# Patient Record
Sex: Female | Born: 1975 | Race: White | Hispanic: No | Marital: Married | Smoking: Never smoker
Health system: Southern US, Community
[De-identification: ages and names within clinical notes are randomized; demographics above are authoritative.]

## PROBLEM LIST (undated history)

## (undated) ENCOUNTER — Inpatient Hospital Stay (HOSPITAL_COMMUNITY): Payer: Self-pay

## (undated) DIAGNOSIS — O09529 Supervision of elderly multigravida, unspecified trimester: Secondary | ICD-10-CM

## (undated) DIAGNOSIS — N02B9 Other recurrent and persistent immunoglobulin A nephropathy: Secondary | ICD-10-CM

## (undated) DIAGNOSIS — Z8619 Personal history of other infectious and parasitic diseases: Secondary | ICD-10-CM

## (undated) DIAGNOSIS — R000 Tachycardia, unspecified: Secondary | ICD-10-CM

## (undated) DIAGNOSIS — N189 Chronic kidney disease, unspecified: Secondary | ICD-10-CM

## (undated) DIAGNOSIS — Z87898 Personal history of other specified conditions: Secondary | ICD-10-CM

## (undated) DIAGNOSIS — N028 Recurrent and persistent hematuria with other morphologic changes: Secondary | ICD-10-CM

## (undated) DIAGNOSIS — E785 Hyperlipidemia, unspecified: Secondary | ICD-10-CM

## (undated) DIAGNOSIS — I493 Ventricular premature depolarization: Secondary | ICD-10-CM

## (undated) HISTORY — DX: Personal history of other specified conditions: Z87.898

## (undated) HISTORY — DX: Other recurrent and persistent immunoglobulin A nephropathy: N02.B9

## (undated) HISTORY — DX: Tachycardia, unspecified: R00.0

## (undated) HISTORY — DX: Ventricular premature depolarization: I49.3

## (undated) HISTORY — PX: RENAL BIOPSY: SHX156

## (undated) HISTORY — DX: Hyperlipidemia, unspecified: E78.5

## (undated) HISTORY — PX: WISDOM TOOTH EXTRACTION: SHX21

## (undated) HISTORY — PX: OTHER SURGICAL HISTORY: SHX169

## (undated) HISTORY — DX: Recurrent and persistent hematuria with other morphologic changes: N02.8

## (undated) HISTORY — DX: Supervision of elderly multigravida, unspecified trimester: O09.529

## (undated) HISTORY — DX: Personal history of other infectious and parasitic diseases: Z86.19

## (undated) HISTORY — DX: Chronic kidney disease, unspecified: N18.9

---

## 1998-04-20 ENCOUNTER — Other Ambulatory Visit: Admission: RE | Admit: 1998-04-20 | Discharge: 1998-04-20 | Payer: Self-pay | Admitting: Obstetrics and Gynecology

## 1999-12-17 ENCOUNTER — Other Ambulatory Visit: Admission: RE | Admit: 1999-12-17 | Discharge: 1999-12-17 | Payer: Self-pay | Admitting: Gynecology

## 2001-01-05 ENCOUNTER — Other Ambulatory Visit: Admission: RE | Admit: 2001-01-05 | Discharge: 2001-01-05 | Payer: Self-pay | Admitting: Gynecology

## 2002-11-05 ENCOUNTER — Other Ambulatory Visit: Admission: RE | Admit: 2002-11-05 | Discharge: 2002-11-05 | Payer: Self-pay | Admitting: Gynecology

## 2003-10-20 ENCOUNTER — Other Ambulatory Visit: Admission: RE | Admit: 2003-10-20 | Discharge: 2003-10-20 | Payer: Self-pay | Admitting: Obstetrics and Gynecology

## 2004-02-03 ENCOUNTER — Inpatient Hospital Stay (HOSPITAL_COMMUNITY): Admission: AD | Admit: 2004-02-03 | Discharge: 2004-02-03 | Payer: Self-pay | Admitting: Obstetrics & Gynecology

## 2004-02-07 ENCOUNTER — Inpatient Hospital Stay (HOSPITAL_COMMUNITY): Admission: AD | Admit: 2004-02-07 | Discharge: 2004-02-07 | Payer: Self-pay | Admitting: Obstetrics and Gynecology

## 2004-02-15 ENCOUNTER — Ambulatory Visit (HOSPITAL_COMMUNITY): Admission: RE | Admit: 2004-02-15 | Discharge: 2004-02-15 | Payer: Self-pay | Admitting: Obstetrics and Gynecology

## 2004-03-09 ENCOUNTER — Inpatient Hospital Stay (HOSPITAL_COMMUNITY): Admission: AD | Admit: 2004-03-09 | Discharge: 2004-03-10 | Payer: Self-pay | Admitting: Obstetrics and Gynecology

## 2004-03-09 ENCOUNTER — Encounter (INDEPENDENT_AMBULATORY_CARE_PROVIDER_SITE_OTHER): Payer: Self-pay | Admitting: *Deleted

## 2005-02-11 ENCOUNTER — Other Ambulatory Visit: Admission: RE | Admit: 2005-02-11 | Discharge: 2005-02-11 | Payer: Self-pay | Admitting: Obstetrics and Gynecology

## 2005-05-17 IMAGING — US US OB COMP LESS 14 WK
1 series · 18 of 23 positions shown · non-contrast
Comparison: none

CLINICAL DATA: Vaginal bleeding. 

 OBSTETRICAL ULTRASOUND
 Number of Fetuses:  1
 Heart Rate:  176 BPM
 CRL:  5.5 cm, 12 W 1 D
 Ultrasound EDC:  08/16/04
 Fetal anatomy could not be evaluated due to the early gestational age.
 MATERNAL FINDINGS
 Right ovary measures 3.3 x 1.7 x 2.2 and left ovary measures 2.6 x 1.7 x 2.6 cm.

[Series 1: us ob comp<14 wk · 18 of 23 slices shown]
[im 1/23]
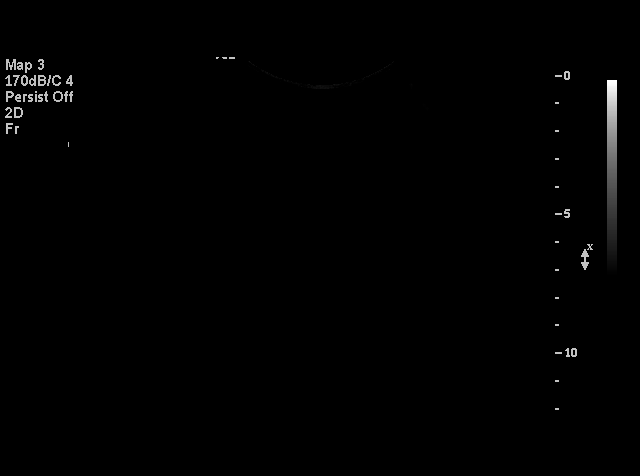
[im 2/23]
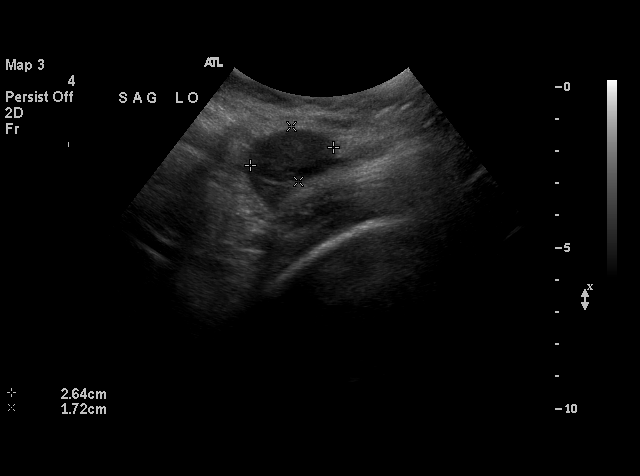
[im 4/23]
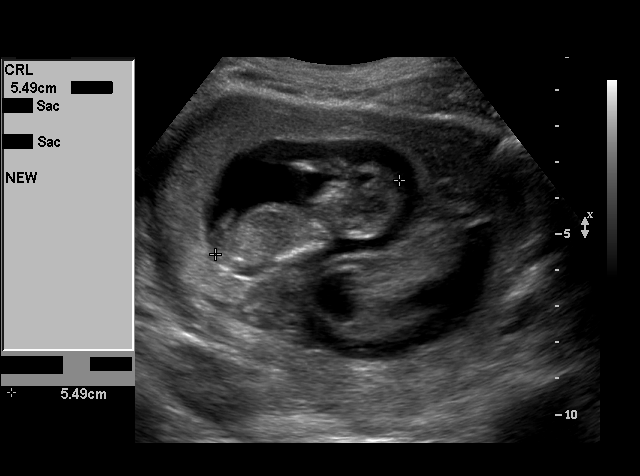
[im 5/23]
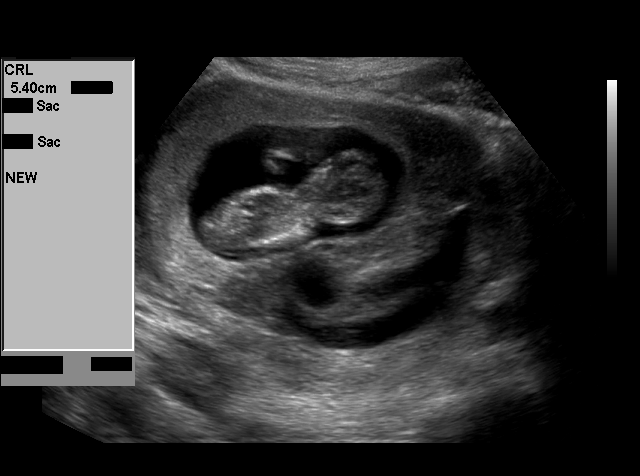
[im 6/23]
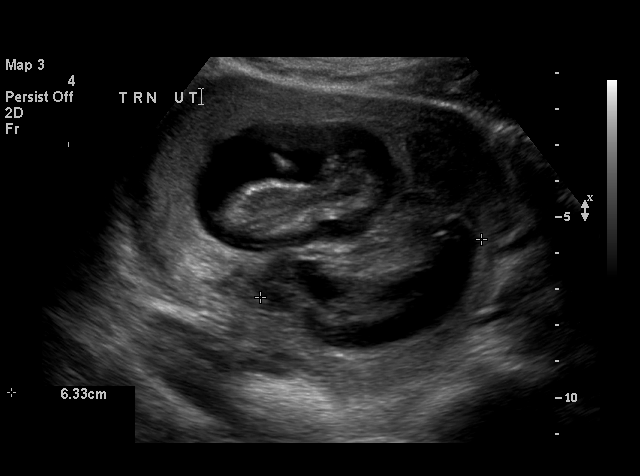
[im 8/23]
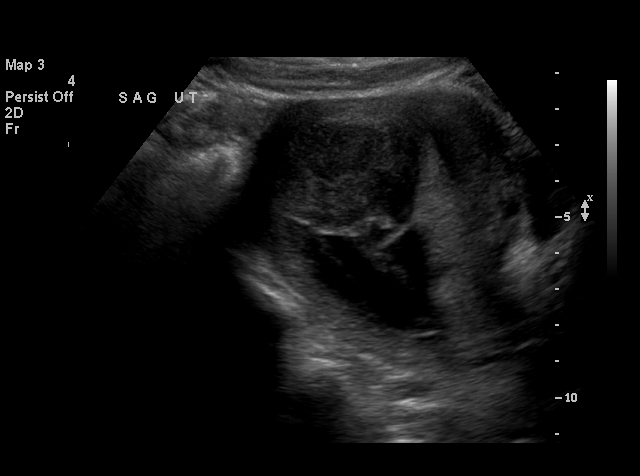
[im 9/23]
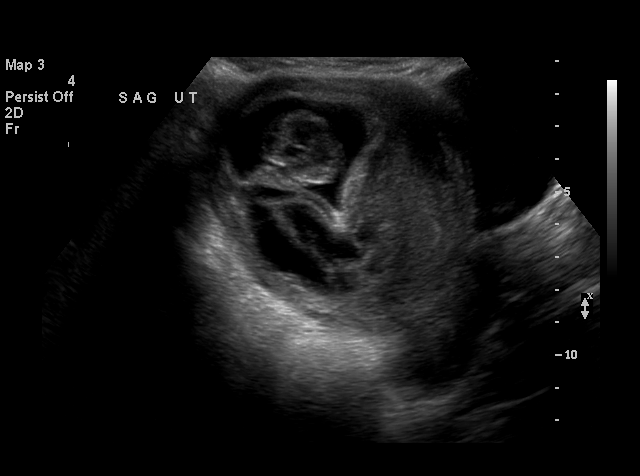
[im 10/23]
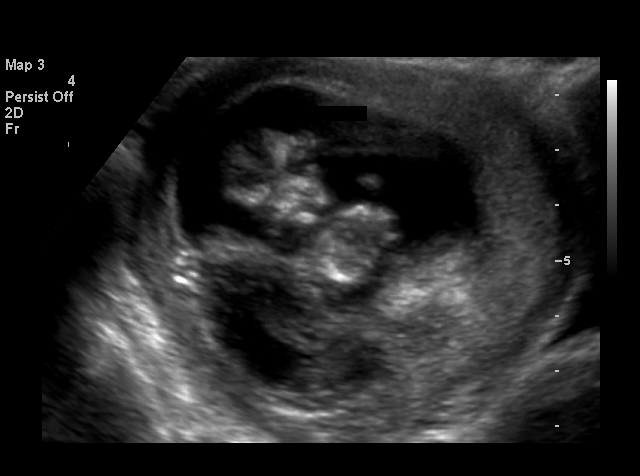
[im 11/23]
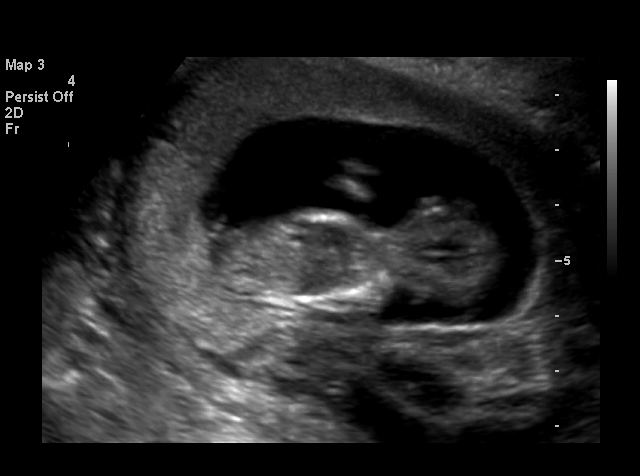
[im 13/23]
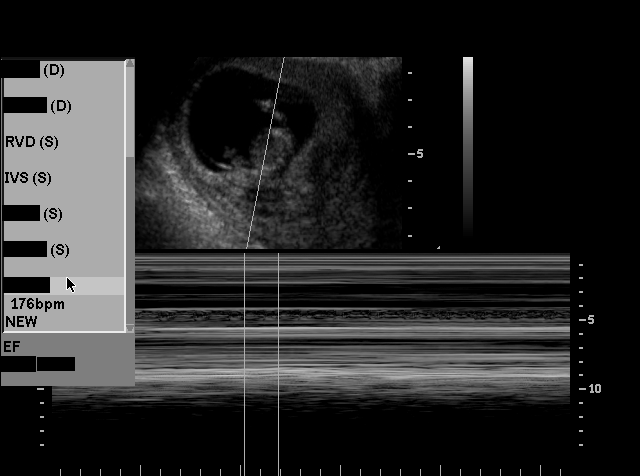
[im 14/23]
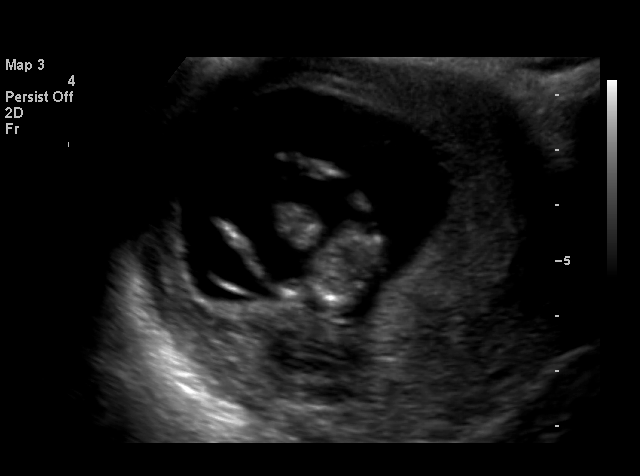
[im 15/23]
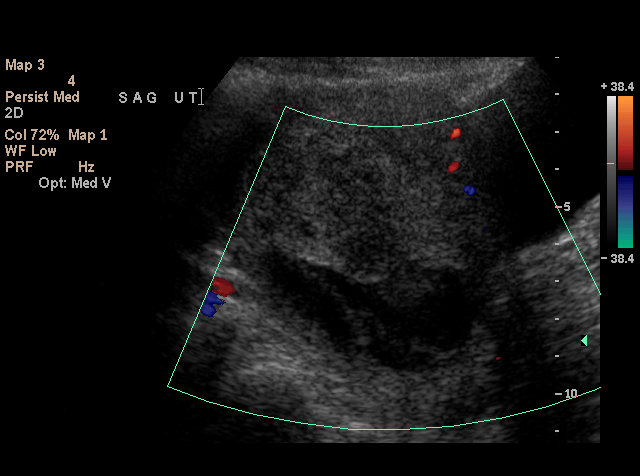
[im 16/23]
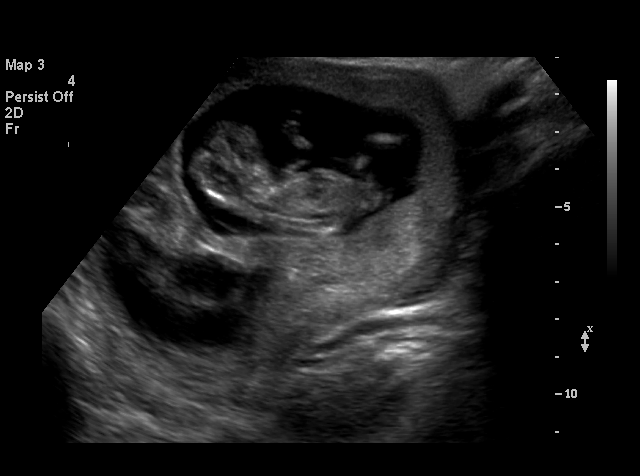
[im 18/23]
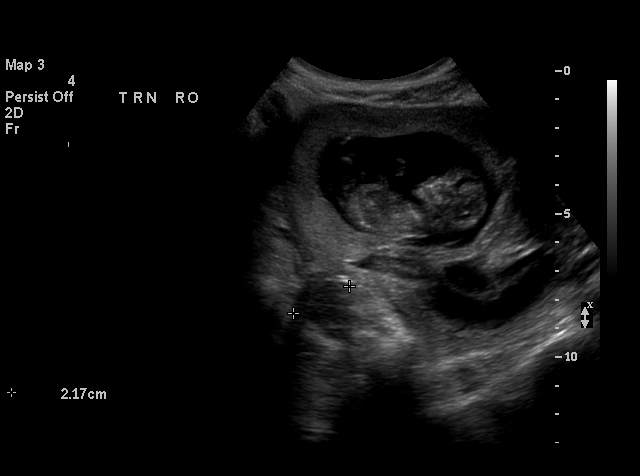
[im 19/23]
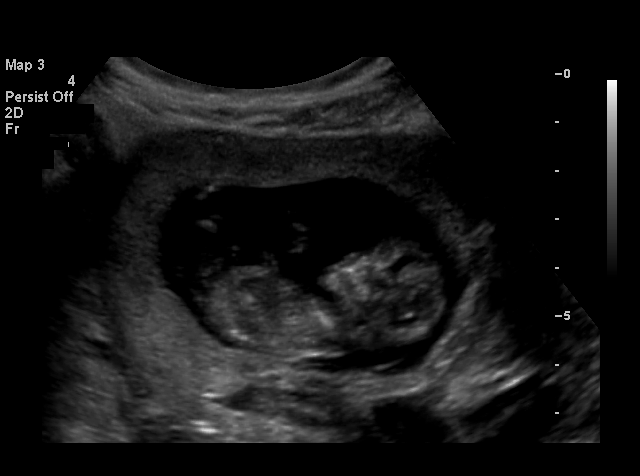
[im 20/23]
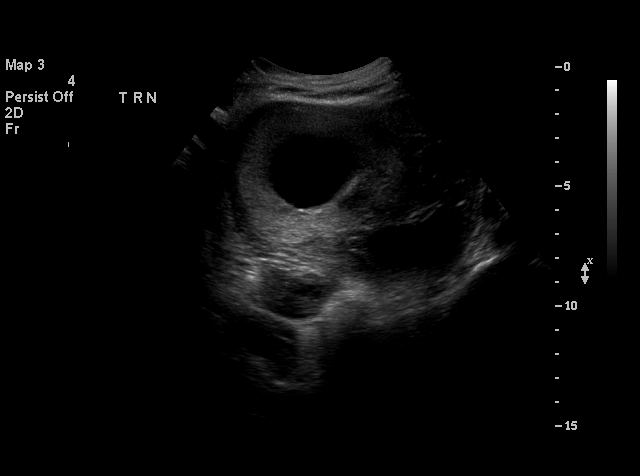
[im 22/23]
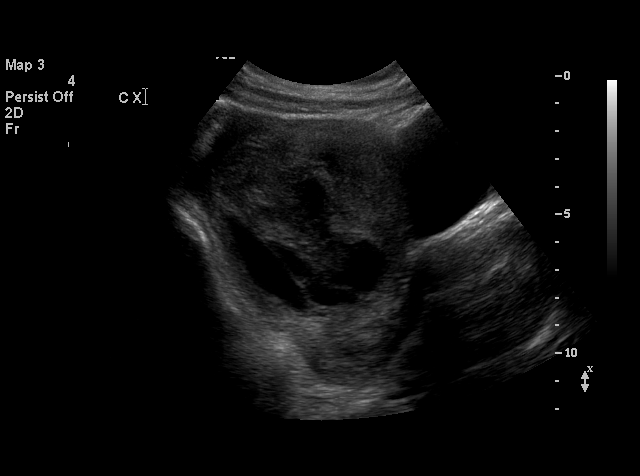
[im 23/23]
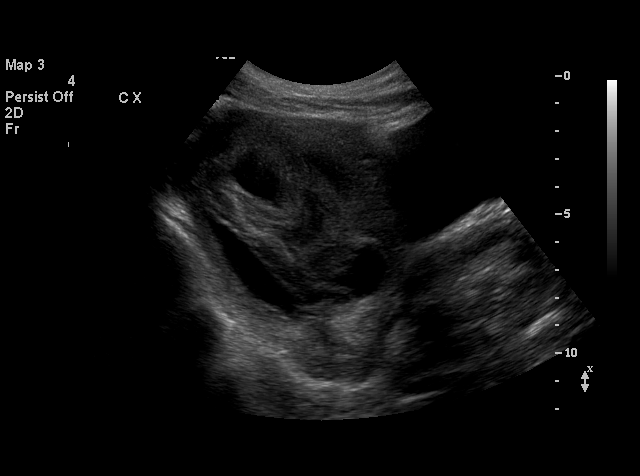

[18 of 23 positions shown; findings below may reference images not displayed]

IMPRESSION: Single living intrauterine gestation with crown-rump length of 5.5 cm, consistent with 12 week 1 day gestation.  Moderate subchorionic hemorrhage noted.  Normal maternal ovaries.

## 2005-05-29 IMAGING — US US OB COMP +14 WK
1 series · 13 of 28 positions shown · non-contrast
Comparison: none

CLINICAL DATA: G1 P0.  LMP 11/07/03.  Bleeding.  Known subchorionic hemorrhage.

[Series 1: unknown · 0.32mm/px · 13 of 35 slices shown]
[im 2/35]
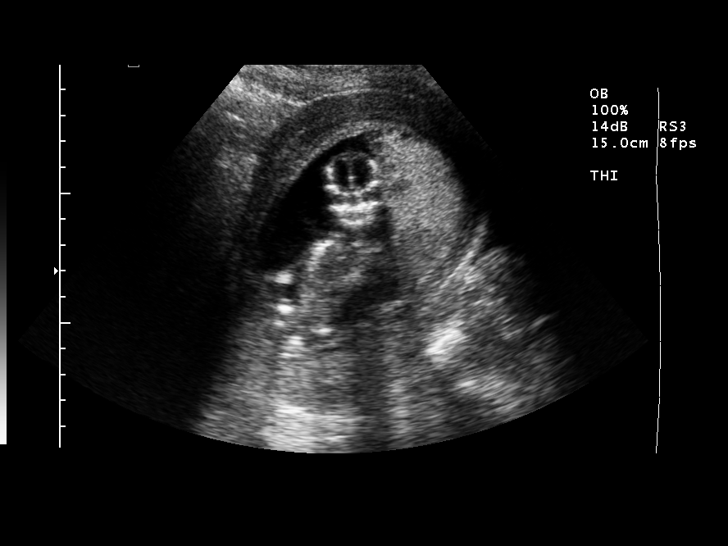
[im 4/35]
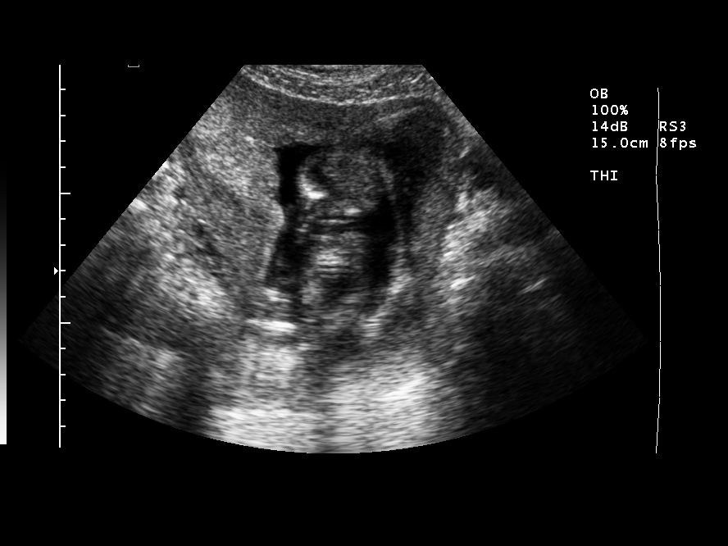
[im 7/35]
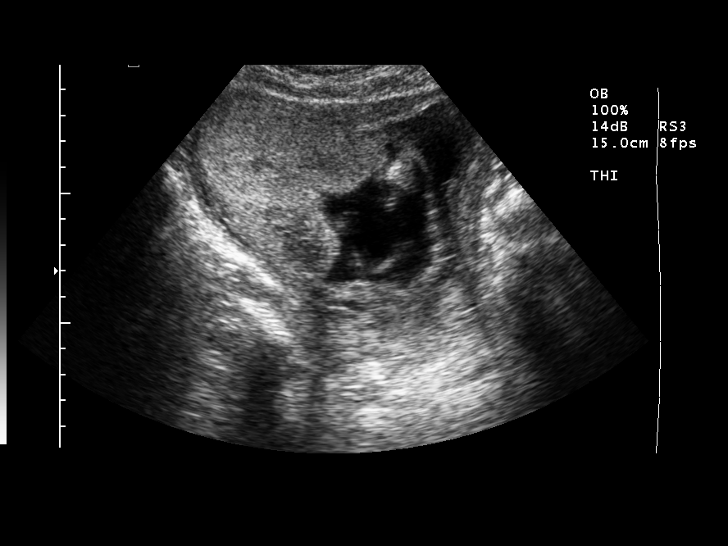
[im 9/35]
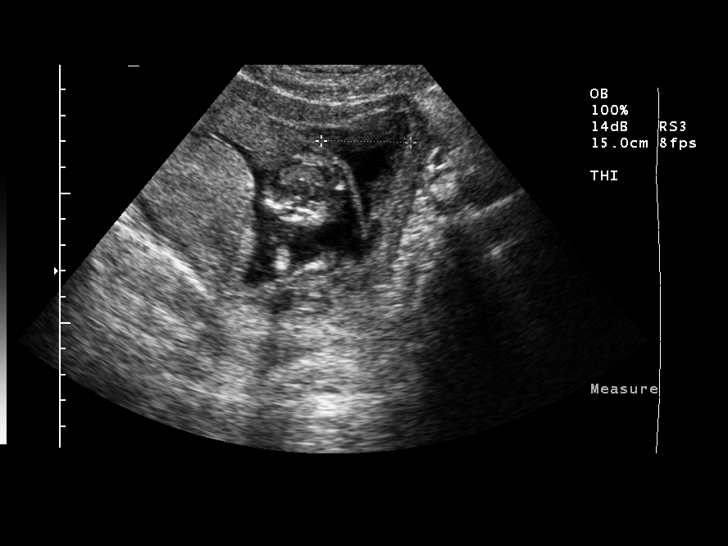
[im 12/35]
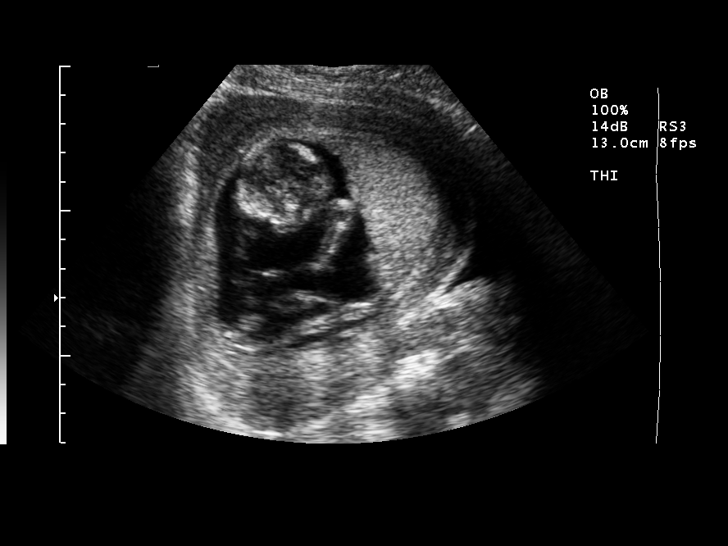
[im 14/35]
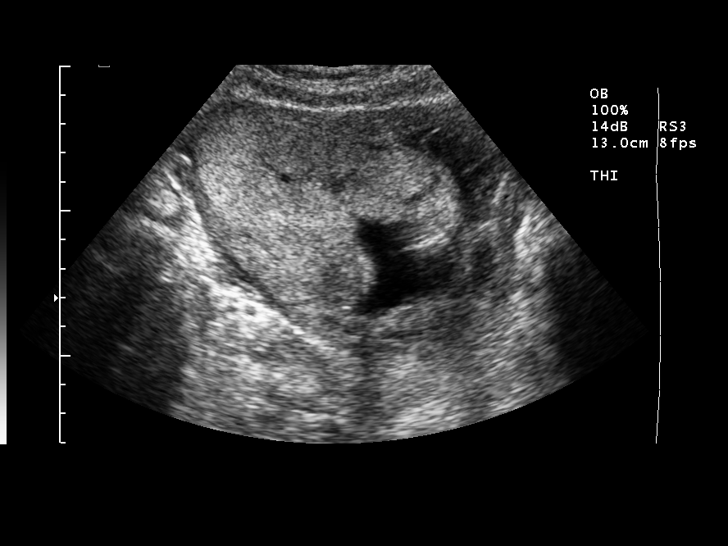
[im 18/35]
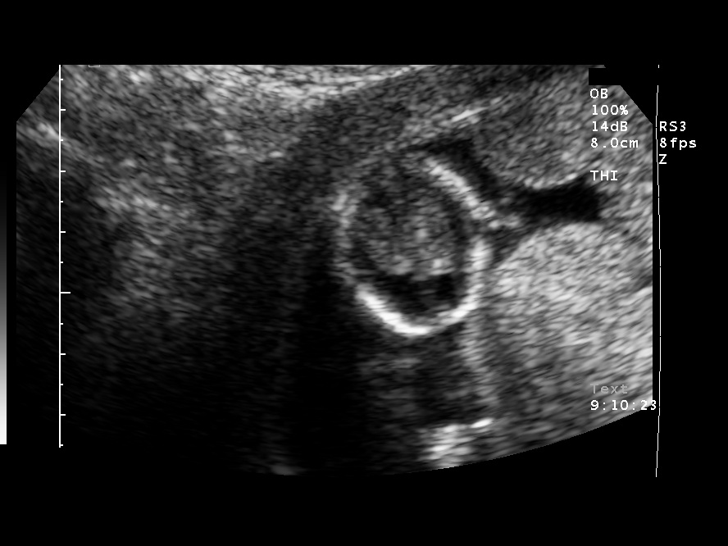
[im 21/35]
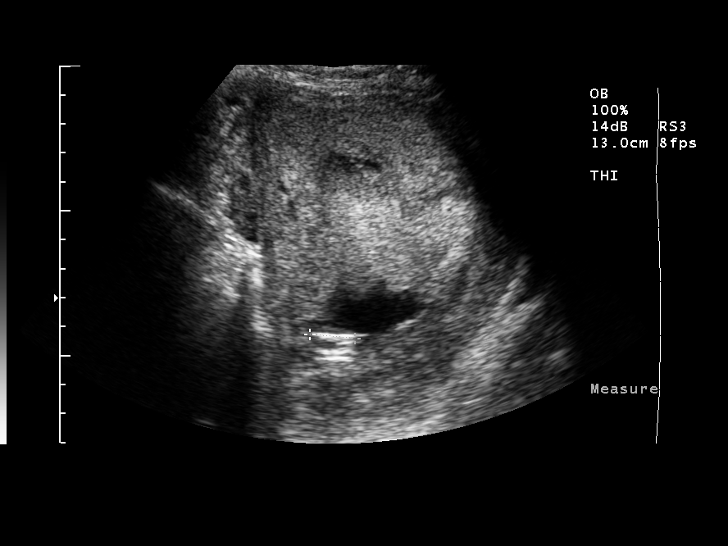
[im 23/35]
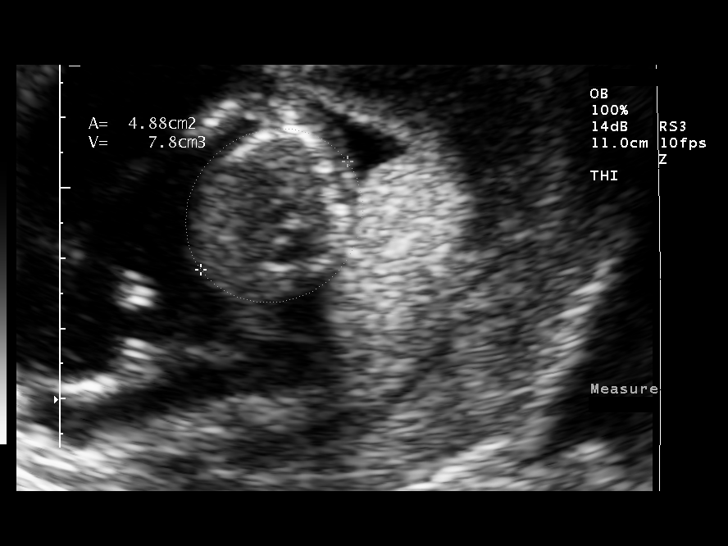
[im 26/35]
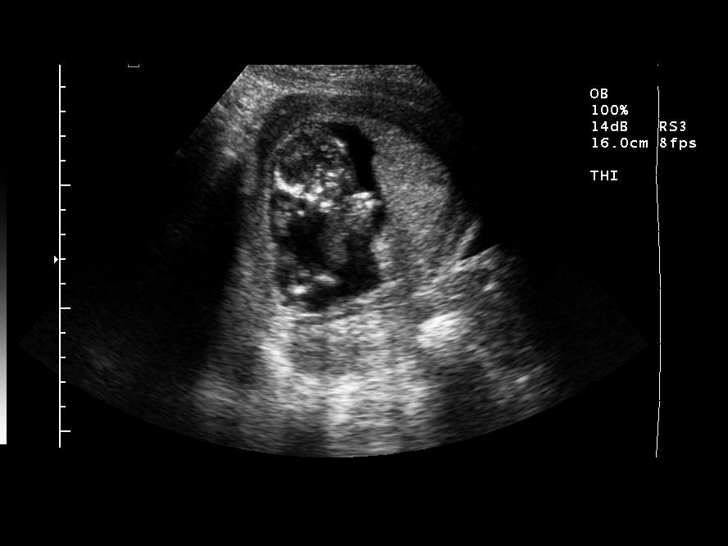
[im 28/35]
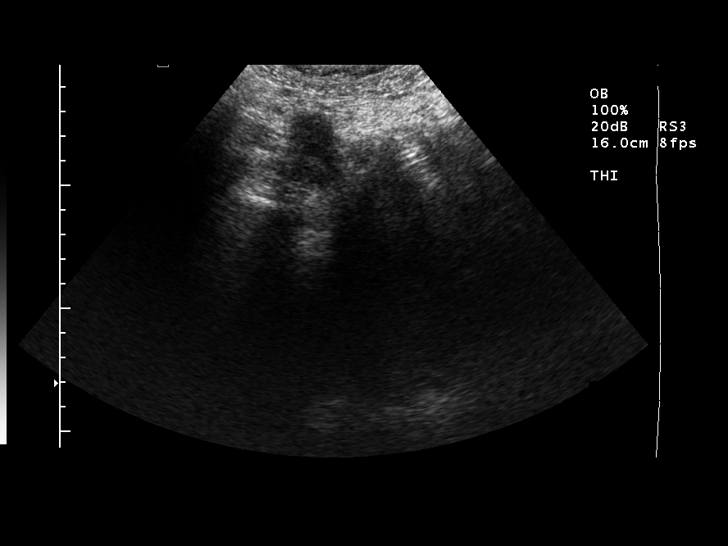
[im 31/35]
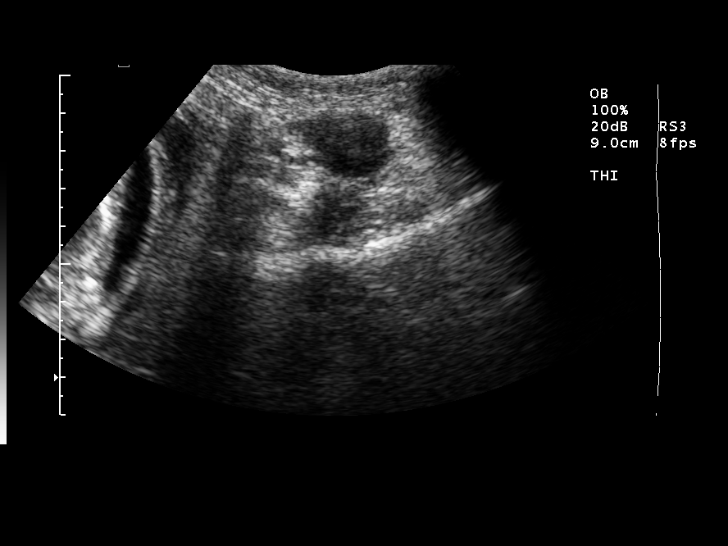
[im 33/35]
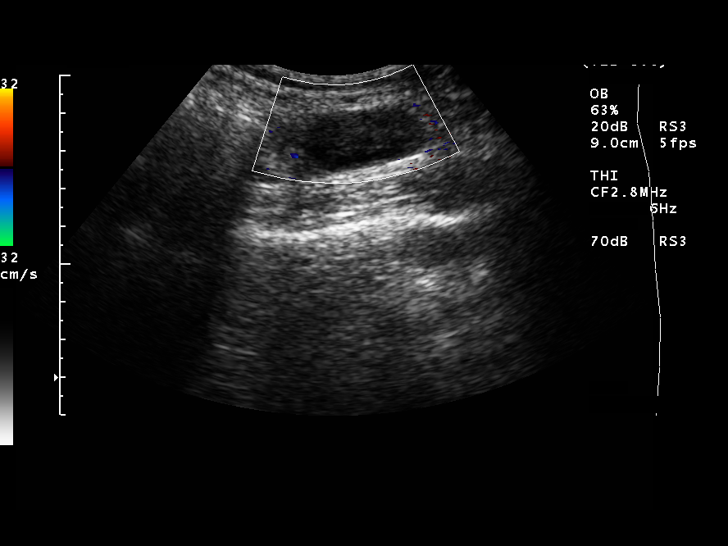

[13 of 28 positions shown; findings below may reference images not displayed]

OBSTETRICAL ULTRASOUND
 Number of Fetuses:  1
 Heart Rate:  165 BPM
 Movement:  Yes
 Breathing:  No    
 Presentation:  Breech
 Placental Location:  Posterior
 Grade:  0
 Previa:  N/A
 Comment:  There is chronic subchorionic hemorrhage anteriorly measuring 4.1 x 2.8 x 3.4 cm.  This is decreased in size compared to prior study of 02/03/04.
 Amniotic Fluid (Subjective):  Normal
 Amniotic Fluid (Objective):   Vertical pocket 4.1 cm

 FETAL BIOMETRY
 BPD:   2.5 cm, 14 W 2 D
 HC:  9.5 cm, 14 W 2 D   
 AC:  7.8 cm, 14 W 2 D
 FL:  1.5 cm, 14 W 3 D

 MEAN GA:   14 W 2 D

 FETAL ANATOMY
 Lateral Ventricles:  CP visualized 
 Thalami/CSP:  Not visualized   
 Posterior Fossa:  Not visualized   
 Nuchal Region:  Not visualized 
 Spine:  Not visualized   
 4 Chamber Heart on Left:  Not visualized   
 Stomach on Left:  Visualized   
 3 Vessel Cord:  Not visualized 
 Cord Insertion site:  Not visualized 
 Kidneys:  Not visualized 
 Bladder:  Not visualized   
 Extremities:  Not visualized   

 Evaluation limited by:  Fetal position and early gestational age 

 MATERNAL FINDINGS
 Cervix: Not evaluated, <16 weeks. 
 Right ovary within normal limits.  Left ovary contains a corpus luteum.
IMPRESSION: Single living intrauterine fetus in breech presentation.  Patient is 14 weeks 2 days by LMP dating and measures 14 weeks 2 days today, indicating appropriate growth.
 Decrease in size of chronic subchorionic hemorrhage, now measuring 4.1 x 2.8 x 3.4 cm.  
 Limited anatomic survey due to early gestational age and uterine positioning.
 Corpus luteum in the left ovary.  Normal right ovary.

 </u12:p>

## 2005-07-19 ENCOUNTER — Inpatient Hospital Stay (HOSPITAL_COMMUNITY): Admission: AD | Admit: 2005-07-19 | Discharge: 2005-07-19 | Payer: Self-pay | Admitting: Obstetrics and Gynecology

## 2005-09-09 ENCOUNTER — Inpatient Hospital Stay (HOSPITAL_COMMUNITY): Admission: AD | Admit: 2005-09-09 | Discharge: 2005-09-11 | Payer: Self-pay | Admitting: Obstetrics and Gynecology

## 2005-10-25 ENCOUNTER — Other Ambulatory Visit: Admission: RE | Admit: 2005-10-25 | Discharge: 2005-10-25 | Payer: Self-pay | Admitting: Obstetrics and Gynecology

## 2006-09-15 ENCOUNTER — Encounter: Admission: RE | Admit: 2006-09-15 | Discharge: 2006-09-15 | Payer: Self-pay | Admitting: Family Medicine

## 2007-06-10 ENCOUNTER — Inpatient Hospital Stay (HOSPITAL_COMMUNITY): Admission: AD | Admit: 2007-06-10 | Discharge: 2007-06-12 | Payer: Self-pay | Admitting: Obstetrics and Gynecology

## 2010-11-30 NOTE — Discharge Summary (Signed)
Jenny Pace, Jenny Pace                          ACCOUNT NO.:  [id number]   MEDICAL RECORD NO.:  [id number]                   PATIENT TYPE:  INP   LOCATION:  9310                                 FACILITY:  [facility]   PHYSICIAN:  Guy Sandifer. Arleta Creek, M.D.           DATE OF BIRTH:  27-Aug-1975   DATE OF ADMISSION:  03/09/2004  DATE OF DISCHARGE:  03/10/2004                                 DISCHARGE SUMMARY   ADMISSION DIAGNOSIS:  Spontaneous abortion at 17-2/7 weeks.   HISTORY OF PRESENT ILLNESS:  This patient is a 35 year old married white  female G1, P0, with an LMP on November 07, 2003, placing her at 17-2/7 weeks.  She has been followed for subchorionic hemorrhage.  Bleeding increased the  evening prior to admission.  Upon evaluation in triage, ultrasound revealed  a fetal heart rate in the 50s.  Re-evaluation subsequent to that revealed  the absence of fetal heart rate.  The patient was admitted for further  management.   HOSPITAL COURSE:  The patient was admitted to the hospital.  Examination  reveals the fetus to be half delivered through the cervix.  She started on  Pitocin.  Bleeding is minimal.  She eventually passes the pregnancy.  It  passes intact.  There is adherent old clot and fibrin deposition consistent  with the chronic subchorionic hemorrhage.  She was also placed on IV  antibiotics.  She remains afebrile.  On the day of discharge, white count is  14.7, hemoglobin 9.0.  Bleeding is scant.  Antiphospholipid and  hypercoagulation studies have been drawn and are pending.  RhoGAM will be  administered per protocol.   DISCHARGE CONDITION:  Good.   DIET:  Regular as tolerated.   ACTIVITY:  No vaginal entry.  No heavy lifting.   DISCHARGE MEDICATIONS:  1. Ceftin 250 mg #10 one p.o. b.i.d.  2. Hemocyte #30 one p.o. every day.  3. Multivitamin daily.   FOLLOWUP:  Follow up is in the office in 4-5 weeks.  She is to call the  office for problems including, but not limited to,  temperature of 101  degrees, heavy bleeding, or increasing pain.                                               Guy Sandifer Arleta Creek, M.D.    JET/MEDQ  D:  03/10/2004  T:  03/12/2004  Job:  [id number]

## 2011-02-06 ENCOUNTER — Encounter: Payer: Self-pay | Admitting: *Deleted

## 2011-02-06 ENCOUNTER — Encounter: Payer: Self-pay | Admitting: Family Medicine

## 2011-02-06 ENCOUNTER — Ambulatory Visit: Payer: Self-pay | Admitting: Family Medicine

## 2011-03-15 ENCOUNTER — Encounter: Payer: Self-pay | Admitting: *Deleted

## 2011-03-15 DIAGNOSIS — I493 Ventricular premature depolarization: Secondary | ICD-10-CM | POA: Insufficient documentation

## 2011-03-15 DIAGNOSIS — Z87898 Personal history of other specified conditions: Secondary | ICD-10-CM | POA: Insufficient documentation

## 2011-03-15 DIAGNOSIS — N189 Chronic kidney disease, unspecified: Secondary | ICD-10-CM | POA: Insufficient documentation

## 2011-03-29 ENCOUNTER — Ambulatory Visit: Payer: Self-pay | Admitting: Cardiology

## 2011-04-12 ENCOUNTER — Encounter: Payer: Self-pay | Admitting: Cardiology

## 2011-04-23 LAB — CBC
Hemoglobin: 10.9 — ABNORMAL LOW
MCHC: 34
MCHC: 34.2
Platelets: 333
Platelets: 370
RBC: 3.61 — ABNORMAL LOW
RBC: 3.89
RDW: 13.4

## 2012-10-29 ENCOUNTER — Ambulatory Visit: Payer: Self-pay | Admitting: Family Medicine

## 2014-03-31 ENCOUNTER — Other Ambulatory Visit: Payer: Self-pay | Admitting: Obstetrics and Gynecology

## 2014-04-01 LAB — CYTOLOGY - PAP

## 2014-05-16 ENCOUNTER — Encounter: Payer: Self-pay | Admitting: *Deleted

## 2015-01-02 LAB — OB RESULTS CONSOLE ABO/RH: RH Type: POSITIVE

## 2015-01-02 LAB — OB RESULTS CONSOLE GC/CHLAMYDIA
CHLAMYDIA, DNA PROBE: NEGATIVE
GC PROBE AMP, GENITAL: NEGATIVE

## 2015-01-02 LAB — OB RESULTS CONSOLE HIV ANTIBODY (ROUTINE TESTING): HIV: NONREACTIVE

## 2015-01-02 LAB — OB RESULTS CONSOLE ANTIBODY SCREEN: ANTIBODY SCREEN: NEGATIVE

## 2015-01-02 LAB — OB RESULTS CONSOLE RUBELLA ANTIBODY, IGM: RUBELLA: IMMUNE

## 2015-01-02 LAB — OB RESULTS CONSOLE HEPATITIS B SURFACE ANTIGEN: HEP B S AG: NEGATIVE

## 2015-01-02 LAB — OB RESULTS CONSOLE RPR: RPR: NONREACTIVE

## 2015-01-24 ENCOUNTER — Other Ambulatory Visit: Payer: Self-pay | Admitting: Obstetrics and Gynecology

## 2015-01-25 LAB — CYTOLOGY - PAP

## 2015-03-01 ENCOUNTER — Ambulatory Visit: Payer: Self-pay | Admitting: Cardiology

## 2015-06-05 ENCOUNTER — Encounter (HOSPITAL_COMMUNITY): Payer: Self-pay | Admitting: *Deleted

## 2015-06-05 ENCOUNTER — Inpatient Hospital Stay (HOSPITAL_COMMUNITY)
Admission: AD | Admit: 2015-06-05 | Discharge: 2015-06-05 | Disposition: A | Discharge status: Home / Self Care | Payer: BC Managed Care – PPO | Source: Ambulatory Visit | Attending: Obstetrics and Gynecology | Admitting: Obstetrics and Gynecology

## 2015-06-05 DIAGNOSIS — O26893 Other specified pregnancy related conditions, third trimester: Secondary | ICD-10-CM | POA: Diagnosis not present

## 2015-06-05 DIAGNOSIS — Z3A30 30 weeks gestation of pregnancy: Secondary | ICD-10-CM | POA: Diagnosis not present

## 2015-06-05 DIAGNOSIS — T1490 Injury, unspecified: Secondary | ICD-10-CM

## 2015-06-05 DIAGNOSIS — O9A213 Injury, poisoning and certain other consequences of external causes complicating pregnancy, third trimester: Secondary | ICD-10-CM
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
## 2015-06-05 NOTE — Discharge Instructions (Signed)
Fetal Movement Counts  Patient Name: __________________________________________________ Patient Due Date: ____________________  Performing a fetal movement count is highly recommended in high-risk pregnancies, but it is good for every pregnant woman to do. Your health care provider may ask you to start counting fetal movements at 28 weeks of the pregnancy. Fetal movements often increase:  · After eating a full meal.  · After physical activity.  · After eating or drinking something sweet or cold.  · At rest.  Pay attention to when you feel the baby is most active. This will help you notice a pattern of your baby's sleep and wake cycles and what factors contribute to an increase in fetal movement. It is important to perform a fetal movement count at the same time each day when your baby is normally most active.   HOW TO COUNT FETAL MOVEMENTS  1. Find a quiet and comfortable area to sit or lie down on your left side. Lying on your left side provides the best blood and oxygen circulation to your baby.  2. Write down the day and time on a sheet of paper or in a journal.  3. Start counting kicks, flutters, swishes, rolls, or jabs in a 2-hour period. You should feel at least 10 movements within 2 hours.  4. If you do not feel 10 movements in 2 hours, wait 2-3 hours and count again. Look for a change in the pattern or not enough counts in 2 hours.  SEEK MEDICAL CARE IF:  · You feel less than 10 counts in 2 hours, tried twice.  · There is no movement in over an hour.  · The pattern is changing or taking longer each day to reach 10 counts in 2 hours.  · You feel the baby is not moving as he or she usually does.  Date: ____________ Movements: ____________ Start time: ____________ Finish time: ____________   Date: ____________ Movements: ____________ Start time: ____________ Finish time: ____________  Date: ____________ Movements: ____________ Start time: ____________ Finish time: ____________  Date: ____________ Movements:  ____________ Start time: ____________ Finish time: ____________  Date: ____________ Movements: ____________ Start time: ____________ Finish time: ____________  Date: ____________ Movements: ____________ Start time: ____________ Finish time: ____________  Date: ____________ Movements: ____________ Start time: ____________ Finish time: ____________  Date: ____________ Movements: ____________ Start time: ____________ Finish time: ____________   Date: ____________ Movements: ____________ Start time: ____________ Finish time: ____________  Date: ____________ Movements: ____________ Start time: ____________ Finish time: ____________  Date: ____________ Movements: ____________ Start time: ____________ Finish time: ____________  Date: ____________ Movements: ____________ Start time: ____________ Finish time: ____________  Date: ____________ Movements: ____________ Start time: ____________ Finish time: ____________  Date: ____________ Movements: ____________ Start time: ____________ Finish time: ____________  Date: ____________ Movements: ____________ Start time: ____________ Finish time: ____________   Date: ____________ Movements: ____________ Start time: ____________ Finish time: ____________  Date: ____________ Movements: ____________ Start time: ____________ Finish time: ____________  Date: ____________ Movements: ____________ Start time: ____________ Finish time: ____________  Date: ____________ Movements: ____________ Start time: ____________ Finish time: ____________  Date: ____________ Movements: ____________ Start time: ____________ Finish time: ____________  Date: ____________ Movements: ____________ Start time: ____________ Finish time: ____________  Date: ____________ Movements: ____________ Start time: ____________ Finish time: ____________   Date: ____________ Movements: ____________ Start time: ____________ Finish time: ____________  Date: ____________ Movements: ____________ Start time: ____________ Finish  time: ____________  Date: ____________ Movements: ____________ Start time: ____________ Finish time: ____________  Date: ____________ Movements: ____________ Start time:   ____________ Finish time: ____________  Date: ____________ Movements: ____________ Start time: ____________ Finish time: ____________  Date: ____________ Movements: ____________ Start time: ____________ Finish time: ____________  Date: ____________ Movements: ____________ Start time: ____________ Finish time: ____________   Date: ____________ Movements: ____________ Start time: ____________ Finish time: ____________  Date: ____________ Movements: ____________ Start time: ____________ Finish time: ____________  Date: ____________ Movements: ____________ Start time: ____________ Finish time: ____________  Date: ____________ Movements: ____________ Start time: ____________ Finish time: ____________  Date: ____________ Movements: ____________ Start time: ____________ Finish time: ____________  Date: ____________ Movements: ____________ Start time: ____________ Finish time: ____________  Date: ____________ Movements: ____________ Start time: ____________ Finish time: ____________   Date: ____________ Movements: ____________ Start time: ____________ Finish time: ____________  Date: ____________ Movements: ____________ Start time: ____________ Finish time: ____________  Date: ____________ Movements: ____________ Start time: ____________ Finish time: ____________  Date: ____________ Movements: ____________ Start time: ____________ Finish time: ____________  Date: ____________ Movements: ____________ Start time: ____________ Finish time: ____________  Date: ____________ Movements: ____________ Start time: ____________ Finish time: ____________  Date: ____________ Movements: ____________ Start time: ____________ Finish time: ____________   Date: ____________ Movements: ____________ Start time: ____________ Finish time: ____________  Date: ____________  Movements: ____________ Start time: ____________ Finish time: ____________  Date: ____________ Movements: ____________ Start time: ____________ Finish time: ____________  Date: ____________ Movements: ____________ Start time: ____________ Finish time: ____________  Date: ____________ Movements: ____________ Start time: ____________ Finish time: ____________  Date: ____________ Movements: ____________ Start time: ____________ Finish time: ____________  Date: ____________ Movements: ____________ Start time: ____________ Finish time: ____________   Date: ____________ Movements: ____________ Start time: ____________ Finish time: ____________  Date: ____________ Movements: ____________ Start time: ____________ Finish time: ____________  Date: ____________ Movements: ____________ Start time: ____________ Finish time: ____________  Date: ____________ Movements: ____________ Start time: ____________ Finish time: ____________  Date: ____________ Movements: ____________ Start time: ____________ Finish time: ____________  Date: ____________ Movements: ____________ Start time: ____________ Finish time: ____________     This information is not intended to replace advice given to you by your health care provider. Make sure you discuss any questions you have with your health care provider.     Document Released: 07/31/2006 Document Revised: 07/22/2014 Document Reviewed: 04/27/2012  Elsevier Interactive Patient Education ©2016 [employer]

## 2015-07-16 NOTE — L&D Delivery Note (Signed)
Delivery Note At 2:44 PM a viable female was delivered via Vaginal, Spontaneous Delivery apgars 9 9 weight pending Placenta status:spontaneously with 3 vessel cord , .  Cord:  with the following complications: none.  Cord pH: not obtained  Anesthesia: Epidural  Episiotomy:  none Lacerations:  first Suture Repair: 2.0 chromic Est. Blood Loss (mL):  300  Mom to postpartum.  Baby to skin to skin.  Ethanjames Fontenot L 08/11/2015, 2:56 PM

## 2015-07-19 LAB — OB RESULTS CONSOLE GBS: GBS: NEGATIVE

## 2015-08-07 ENCOUNTER — Encounter (HOSPITAL_COMMUNITY): Payer: Self-pay | Admitting: *Deleted

## 2015-08-07 ENCOUNTER — Telehealth (HOSPITAL_COMMUNITY): Payer: Self-pay | Admitting: *Deleted

## 2015-08-07 NOTE — Telephone Encounter (Signed)
Preadmission screen  

## 2015-08-09 ENCOUNTER — Telehealth (HOSPITAL_COMMUNITY): Payer: Self-pay | Admitting: *Deleted

## 2015-08-09 ENCOUNTER — Encounter (HOSPITAL_COMMUNITY): Payer: Self-pay | Admitting: *Deleted

## 2015-08-09 NOTE — Telephone Encounter (Signed)
Preadmission screen  

## 2015-08-10 ENCOUNTER — Other Ambulatory Visit (HOSPITAL_COMMUNITY): Payer: Self-pay | Admitting: Obstetrics and Gynecology

## 2015-08-11 ENCOUNTER — Inpatient Hospital Stay (HOSPITAL_COMMUNITY): Payer: BC Managed Care – PPO | Admitting: Anesthesiology

## 2015-08-11 ENCOUNTER — Inpatient Hospital Stay (HOSPITAL_COMMUNITY)
Admission: RE | Admit: 2015-08-11 | Discharge: 2015-08-12 | DRG: 775 | Disposition: A | Discharge status: Home / Self Care | Payer: BC Managed Care – PPO | Source: Ambulatory Visit | Attending: Obstetrics and Gynecology | Admitting: Obstetrics and Gynecology

## 2015-08-11 ENCOUNTER — Encounter (HOSPITAL_COMMUNITY): Payer: Self-pay

## 2015-08-11 VITALS — BP 125/74 | HR 76 | Temp 98.0°F | Resp 18 | Ht 64.0 in | Wt 154.0 lb

## 2015-08-11 DIAGNOSIS — I493 Ventricular premature depolarization: Secondary | ICD-10-CM

## 2015-08-11 DIAGNOSIS — O26833 Pregnancy related renal disease, third trimester: Secondary | ICD-10-CM | POA: Diagnosis present

## 2015-08-11 DIAGNOSIS — N189 Chronic kidney disease, unspecified: Secondary | ICD-10-CM | POA: Diagnosis present

## 2015-08-11 DIAGNOSIS — Z3A39 39 weeks gestation of pregnancy: Secondary | ICD-10-CM | POA: Diagnosis not present

## 2015-08-11 DIAGNOSIS — Z3490 Encounter for supervision of normal pregnancy, unspecified, unspecified trimester: Secondary | ICD-10-CM

## 2015-08-11 DIAGNOSIS — Z8249 Family history of ischemic heart disease and other diseases of the circulatory system: Secondary | ICD-10-CM

## 2015-08-11 DIAGNOSIS — Z87898 Personal history of other specified conditions: Secondary | ICD-10-CM

## 2015-08-11 DIAGNOSIS — O26893 Other specified pregnancy related conditions, third trimester: Secondary | ICD-10-CM | POA: Diagnosis present

## 2015-08-11 LAB — CBC
HEMATOCRIT: 32.6 % — AB (ref 36.0–46.0)
Hemoglobin: 10.8 g/dL — ABNORMAL LOW (ref 12.0–15.0)
MCH: 28.6 pg (ref 26.0–34.0)
MCHC: 33.1 g/dL (ref 30.0–36.0)
MCV: 86.5 fL (ref 78.0–100.0)
Platelets: 338 10*3/uL (ref 150–400)
RBC: 3.77 MIL/uL — ABNORMAL LOW (ref 3.87–5.11)
RDW: 14.1 % (ref 11.5–15.5)
WBC: 11.2 10*3/uL — ABNORMAL HIGH (ref 4.0–10.5)

## 2015-08-11 MED ORDER — PHENYLEPHRINE 40 MCG/ML (10ML) SYRINGE FOR IV PUSH (FOR BLOOD PRESSURE SUPPORT)
80.0000 ug | PREFILLED_SYRINGE | INTRAVENOUS | Status: DC | PRN
  Filled 2015-08-11: qty 20
  Filled 2015-08-11: qty 2

## 2015-08-11 MED ORDER — MEASLES, MUMPS & RUBELLA VAC ~~LOC~~ INJ: 0.5000 mL | INJECTION | Freq: Once | SUBCUTANEOUS | Status: DC

## 2015-08-11 MED ORDER — TERBUTALINE SULFATE 1 MG/ML IJ SOLN
0.2500 mg | Freq: Once | INTRAMUSCULAR | Status: DC | PRN
  Filled 2015-08-11: qty 1

## 2015-08-11 MED ORDER — ZOLPIDEM TARTRATE 5 MG PO TABS: 5.0000 mg | ORAL_TABLET | Freq: Every evening | ORAL | Status: DC | PRN

## 2015-08-11 MED ORDER — OXYCODONE-ACETAMINOPHEN 5-325 MG PO TABS: 1.0000 | ORAL_TABLET | ORAL | Status: DC | PRN

## 2015-08-11 MED ORDER — OXYTOCIN 10 UNIT/ML IJ SOLN: 2.5000 [IU]/h | INTRAVENOUS | Status: DC

## 2015-08-11 MED ORDER — BENZOCAINE-MENTHOL 20-0.5 % EX AERO
1.0000 "application " | INHALATION_SPRAY | CUTANEOUS | Status: DC | PRN
  Administered 2015-08-11: 1 via TOPICAL
  Filled 2015-08-11: qty 56

## 2015-08-11 MED ORDER — OXYTOCIN 10 UNIT/ML IJ SOLN
1.0000 m[IU]/min | INTRAVENOUS | Status: DC
  Administered 2015-08-11: 2 m[IU]/min via INTRAVENOUS
  Filled 2015-08-11: qty 4

## 2015-08-11 MED ORDER — WITCH HAZEL-GLYCERIN EX PADS: 1.0000 "application " | MEDICATED_PAD | CUTANEOUS | Status: DC | PRN

## 2015-08-11 MED ORDER — LACTATED RINGERS IV SOLN
INTRAVENOUS | Status: DC
Start: 2015-08-11 — End: 2015-08-11
  Administered 2015-08-11 (×2): via INTRAVENOUS

## 2015-08-11 MED ORDER — ONDANSETRON HCL 4 MG/2ML IJ SOLN: 4.0000 mg | Freq: Four times a day (QID) | INTRAMUSCULAR | Status: DC | PRN

## 2015-08-11 MED ORDER — DIPHENHYDRAMINE HCL 25 MG PO CAPS: 25.0000 mg | ORAL_CAPSULE | Freq: Four times a day (QID) | ORAL | Status: DC | PRN

## 2015-08-11 MED ORDER — FENTANYL 2.5 MCG/ML BUPIVACAINE 1/10 % EPIDURAL INFUSION (WH - ANES)
14.0000 mL/h | INTRAMUSCULAR | Status: DC | PRN
  Administered 2015-08-11: 14 mL/h via EPIDURAL
  Filled 2015-08-11: qty 125

## 2015-08-11 MED ORDER — IBUPROFEN 600 MG PO TABS
600.0000 mg | ORAL_TABLET | Freq: Four times a day (QID) | ORAL | Status: DC
  Administered 2015-08-11 – 2015-08-12 (×5): 600 mg via ORAL
  Filled 2015-08-11 (×5): qty 1

## 2015-08-11 MED ORDER — ACETAMINOPHEN 325 MG PO TABS: 650.0000 mg | ORAL_TABLET | ORAL | Status: DC | PRN

## 2015-08-11 MED ORDER — DIBUCAINE 1 % RE OINT: 1.0000 "application " | TOPICAL_OINTMENT | RECTAL | Status: DC | PRN

## 2015-08-11 MED ORDER — LIDOCAINE HCL (PF) 1 % IJ SOLN
INTRAMUSCULAR | Status: DC | PRN
  Administered 2015-08-11: 8 mL via EPIDURAL

## 2015-08-11 MED ORDER — ONDANSETRON HCL 4 MG PO TABS: 4.0000 mg | ORAL_TABLET | ORAL | Status: DC | PRN

## 2015-08-11 MED ORDER — MEDROXYPROGESTERONE ACETATE 150 MG/ML IM SUSP: 150.0000 mg | INTRAMUSCULAR | Status: DC | PRN

## 2015-08-11 MED ORDER — ONDANSETRON HCL 4 MG/2ML IJ SOLN: 4.0000 mg | INTRAMUSCULAR | Status: DC | PRN

## 2015-08-11 MED ORDER — FLEET ENEMA 7-19 GM/118ML RE ENEM: 1.0000 | ENEMA | Freq: Every day | RECTAL | Status: DC | PRN

## 2015-08-11 MED ORDER — OXYTOCIN BOLUS FROM INFUSION: 500.0000 mL | INTRAVENOUS | Status: DC

## 2015-08-11 MED ORDER — SENNOSIDES-DOCUSATE SODIUM 8.6-50 MG PO TABS
2.0000 | ORAL_TABLET | ORAL | Status: DC
  Administered 2015-08-11: 2 via ORAL
  Filled 2015-08-11: qty 2

## 2015-08-11 MED ORDER — EPHEDRINE 5 MG/ML INJ
10.0000 mg | INTRAVENOUS | Status: DC | PRN
  Filled 2015-08-11: qty 2

## 2015-08-11 MED ORDER — CITRIC ACID-SODIUM CITRATE 334-500 MG/5ML PO SOLN: 30.0000 mL | ORAL | Status: DC | PRN

## 2015-08-11 MED ORDER — DIPHENHYDRAMINE HCL 50 MG/ML IJ SOLN: 12.5000 mg | INTRAMUSCULAR | Status: DC | PRN

## 2015-08-11 MED ORDER — SIMETHICONE 80 MG PO CHEW: 80.0000 mg | CHEWABLE_TABLET | ORAL | Status: DC | PRN

## 2015-08-11 MED ORDER — TETANUS-DIPHTH-ACELL PERTUSSIS 5-2.5-18.5 LF-MCG/0.5 IM SUSP: 0.5000 mL | Freq: Once | INTRAMUSCULAR | Status: DC

## 2015-08-11 MED ORDER — LANOLIN HYDROUS EX OINT: TOPICAL_OINTMENT | CUTANEOUS | Status: DC | PRN

## 2015-08-11 MED ORDER — OXYCODONE-ACETAMINOPHEN 5-325 MG PO TABS: 2.0000 | ORAL_TABLET | ORAL | Status: DC | PRN

## 2015-08-11 MED ORDER — PRENATAL MULTIVITAMIN CH
1.0000 | ORAL_TABLET | Freq: Every day | ORAL | Status: DC
  Administered 2015-08-12: 1 via ORAL
  Filled 2015-08-11: qty 1

## 2015-08-11 MED ORDER — BISACODYL 10 MG RE SUPP: 10.0000 mg | Freq: Every day | RECTAL | Status: DC | PRN

## 2015-08-11 MED ORDER — LACTATED RINGERS IV SOLN: 500.0000 mL | INTRAVENOUS | Status: DC | PRN

## 2015-08-11 MED ORDER — LIDOCAINE HCL (PF) 1 % IJ SOLN
30.0000 mL | INTRAMUSCULAR | Status: DC | PRN
  Filled 2015-08-11: qty 30

## 2015-08-11 NOTE — H&P (Signed)
Jenny Pace is a 40 y.o. G 4 P 2012 at 72 w 6 days presents for IOL secondary to favorable cervix. Maternal Medical History:  Reason for admission: Contractions.   Fetal activity: Perceived fetal activity is normal.    Prenatal complications: no prenatal complications   OB History    Gravida Para Term Preterm AB TAB SAB Ectopic Multiple Living   0 Past Medical History  Diagnosis Date  . PVC (premature ventricular contraction)   . Chronic kidney disease     juvenile kidney disease  . Hyperlipidemia   . History of vertigo   . Hx of varicella   . Chronic tachycardia   . 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 AMA (advanced maternal age) multigravida 35+    Past Surgical History  Procedure Laterality Date  . Other surgical history      childbirth x2  . Wisdom tooth extraction    . Renal biopsy     Family History: family history includes Cancer in her father; Hypertension in her brother; Hyperthyroidism in her maternal grandmother and mother; Mitral valve prolapse in her father. Social History:  reports that she has never smoked. She has never used smokeless tobacco. She reports that she does not drink alcohol or use illicit drugs.   Prenatal Transfer Tool  Maternal Diabetes: No Genetic Screening: Normal Maternal Ultrasounds/Referrals: Normal Fetal Ultrasounds or other Referrals:  None Maternal Substance Abuse:  No Significant Maternal Medications:  None Significant Maternal Lab Results:  None Other Comments:  None  Review of Systems  All other systems reviewed and are negative.   Dilation: 2.5 Effacement (%): 80 Station: -2 Exam by:: Dannell Gortney Blood pressure 142/87, pulse 89, height  (1.626 m), weight 154 lb (69.854 kg). Maternal Exam:  Abdomen: Fetal presentation: vertex  Introitus: not evaluated.     Fetal Exam Fetal State Assessment: Category I - tracings are normal.     Physical Exam  Nursing note and vitals  reviewed. Constitutional: She appears well-developed.  HENT:  Head: Normocephalic.  Eyes: Pupils are equal, round, and reactive to light.  Neck: Normal range of motion.  Cardiovascular: Normal rate and regular rhythm.     Prenatal labs: ABO, Rh: A/Positive/-- (06/20 0000) Antibody: Negative (06/20 0000) Rubella: Immune (06/20 0000) RPR: Nonreactive (06/20 0000)  HBsAg: Negative (06/20 0000)  HIV: Non-reactive (06/20 0000)  GBS: Negative (01/04 0000)   Assessment/Plan: IUP at 39 w 6 days Favorable Cervix IOL Pitocin prn Epidural per maternal request  Jerrid Forgette L 08/11/2015, 8:11 AM

## 2015-08-11 NOTE — Anesthesia Procedure Notes (Signed)
Epidural Patient location during procedure: OB Start time: 08/11/2015 10:43 AM End time: 08/11/2015 10:47 AM  Staffing Anesthesiologist: Leilani Able Performed by: anesthesiologist   Preanesthetic Checklist Completed: patient identified, surgical consent, pre-op evaluation, timeout performed, IV checked, risks and benefits discussed and monitors and equipment checked  Epidural Patient position: sitting Prep: site prepped and draped and DuraPrep Patient monitoring: continuous pulse ox and blood pressure Approach: midline Location: L3-L4 Injection technique: LOR air  Needle:  Needle type: Tuohy  Needle gauge: 17 G Needle length: 9 cm and 9 Needle insertion depth: 5 cm cm Catheter type: closed end flexible Catheter size: 19 Gauge Catheter at skin depth: 10 cm Test dose: negative and Other  Assessment Sensory level: T9 Events: blood not aspirated, injection not painful, no injection resistance, negative IV test and no paresthesia  Additional Notes Reason for block:procedure for pain

## 2015-08-11 NOTE — Anesthesia Preprocedure Evaluation (Signed)

## 2015-08-12 LAB — CBC
HCT: 28.1 % — ABNORMAL LOW (ref 36.0–46.0)
HEMOGLOBIN: 9.3 g/dL — AB (ref 12.0–15.0)
MCH: 28.8 pg (ref 26.0–34.0)
MCHC: 33.1 g/dL (ref 30.0–36.0)
MCV: 87 fL (ref 78.0–100.0)
Platelets: 266 10*3/uL (ref 150–400)
RBC: 3.23 MIL/uL — AB (ref 3.87–5.11)
RDW: 14.2 % (ref 11.5–15.5)
WBC: 12.9 10*3/uL — ABNORMAL HIGH (ref 4.0–10.5)

## 2015-08-12 MED ORDER — INFLUENZA VAC SPLIT QUAD 0.5 ML IM SUSY
0.5000 mL | PREFILLED_SYRINGE | INTRAMUSCULAR | Status: AC
  Administered 2015-08-12: 0.5 mL via INTRAMUSCULAR

## 2015-08-12 NOTE — Discharge Summary (Signed)
Obstetric Discharge Summary Reason for Admission: induction of labor Prenatal Procedures: none Intrapartum Procedures: spontaneous vaginal delivery Postpartum Procedures: none Complications-Operative and Postpartum: first degree perineal laceration HEMOGLOBIN  Date Value Ref Range Status  08/12/2015 9.3* 12.0 - 15.0 g/dL Final   HCT  Date Value Ref Range Status  08/12/2015 28.1* 36.0 - 46.0 % Final    Physical Exam:  General: alert, cooperative and appears stated age 40: appropriate Uterine Fundus: firm Incision: healing well, no significant drainage DVT Evaluation: No evidence of DVT seen on physical exam.  Discharge Diagnoses: Term Pregnancy-delivered  Discharge Information: Date: 08/12/2015 Activity: pelvic rest Diet: routine Medications: None Condition: stable Instructions: refer to practice specific booklet Discharge to: home   Newborn Data: Live born female  Birth Weight: 6 lb 13 oz (3090 g) APGAR: 8, 9  Home with mother.  Derryl Uher L 08/12/2015, 7:39 AM

## 2015-08-12 NOTE — Anesthesia Postprocedure Evaluation (Signed)
Anesthesia Post Note  Patient: Jenny Pace  Procedure(s) Performed: * No procedures listed *  Patient location during evaluation: Mother Baby Anesthesia Type: Epidural Level of consciousness: awake Pain management: satisfactory to patient Vital Signs Assessment: post-procedure vital signs reviewed and stable Respiratory status: spontaneous breathing Cardiovascular status: stable Anesthetic complications: no    Last Vitals:  Filed Vitals:   08/11/15 2158 08/12/15 0610  BP: 121/74 125/74  Pulse: 83 76  Temp: 37.1 C 36.7 C  Resp: 18 18    Last Pain:  Filed Vitals:   08/12/15 0630  PainSc: 0-No pain                 Ailea Rhatigan

## 2015-08-12 NOTE — Progress Notes (Addendum)
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  Patient states that she was cleared of Berger's disease when she was 84, however MD's were unsure if this disease would recur during pregnancy.  Highest recorded BP was 155/82 that this RN noted in chart, which was during labor.  Patient aware of this BP and others which were above 140/80 during labor.  Current BP is WNL.  Patient currently states feels no PVC's, no irregular pulse, pulse is WNL and not bounding, and patient states is able to check both pulse and BP at home.  This RN educated patient regarding the signs and symptoms of increased BP, increased pulse, and irregular pulse.  Patient able to teach back this information, states has appropriate follow-up, and states no additional teaching/information is necessary to manage her condition at home.

## 2015-08-12 NOTE — Lactation Note (Signed)
This note was copied from the chart of Jenny Edee Nifong. Lactation Consultation Note  Initial visit.  Mom is experienced breastfeeding her previous 3 babies.  She states newborn is feeding well.  Baby is currently latched well and suckling actively.  Mom will be discharged at 24 hours.  Breastfeeding consultation services and support information given and encouraged prn.  No concerns at present.  Patient Name: Jenny Pace [id number] Date: 08/12/2015 Reason for consult: Initial assessment   Maternal Data    Feeding Feeding Type: Breast Fed Length of feed: 15 min  LATCH Score/Interventions Latch: Grasps breast easily, tongue down, lips flanged, rhythmical sucking. Intervention(s): Adjust position;Assist with latch;Breast massage;Breast compression  Audible Swallowing: A few with stimulation Intervention(s): Skin to skin;Hand expression;Alternate breast massage  Type of Nipple: Everted at rest and after stimulation  Comfort (Breast/Nipple): Soft / non-tender     Hold (Positioning): No assistance needed to correctly position infant at breast.  LATCH Score: 9  Lactation Tools Discussed/Used     Consult Status      Huston Foley 08/12/2015, 11:55 AM

## 2015-08-16 LAB — RUBELLA SCREEN: Rubella: 6.98 index (ref >0.99)

## 2015-08-16 LAB — RPR: RPR: NONREACTIVE

## 2019-09-18 ENCOUNTER — Ambulatory Visit: Payer: BC Managed Care – PPO
